# Patient Record
Sex: Female | Born: 1970 | Race: White | Hispanic: No | Smoking: Never smoker
Health system: Southern US, Community
[De-identification: ages and names within clinical notes are randomized; demographics above are authoritative.]

## PROBLEM LIST (undated history)

## (undated) DIAGNOSIS — F32A Depression, unspecified: Secondary | ICD-10-CM

## (undated) DIAGNOSIS — Z9889 Other specified postprocedural states: Secondary | ICD-10-CM

## (undated) DIAGNOSIS — F419 Anxiety disorder, unspecified: Secondary | ICD-10-CM

## (undated) DIAGNOSIS — R112 Nausea with vomiting, unspecified: Secondary | ICD-10-CM

## (undated) DIAGNOSIS — F329 Major depressive disorder, single episode, unspecified: Secondary | ICD-10-CM

## (undated) HISTORY — PX: TUBAL LIGATION: SHX77

## (undated) HISTORY — PX: WISDOM TOOTH EXTRACTION: SHX21

## (undated) HISTORY — PX: MYRINGOTOMY: SUR874

## (undated) HISTORY — PX: NOVASURE ABLATION: SHX5394

## (undated) HISTORY — PX: COLONOSCOPY: SHX174

---

## 2015-07-29 ENCOUNTER — Emergency Department (HOSPITAL_COMMUNITY): Payer: No Typology Code available for payment source

## 2015-07-29 ENCOUNTER — Encounter (HOSPITAL_COMMUNITY): Payer: Self-pay | Admitting: Emergency Medicine

## 2015-07-29 ENCOUNTER — Emergency Department (HOSPITAL_COMMUNITY)
Admission: EM | Admit: 2015-07-29 | Discharge: 2015-07-29 | Disposition: A | Discharge status: Home / Self Care | Payer: No Typology Code available for payment source | Attending: Emergency Medicine | Admitting: Emergency Medicine

## 2015-07-29 DIAGNOSIS — Z043 Encounter for examination and observation following other accident: Secondary | ICD-10-CM

## 2015-07-29 DIAGNOSIS — S8991XA Unspecified injury of right lower leg, initial encounter: Secondary | ICD-10-CM | POA: Diagnosis not present

## 2015-07-29 DIAGNOSIS — Z791 Long term (current) use of non-steroidal anti-inflammatories (NSAID): Secondary | ICD-10-CM | POA: Diagnosis not present

## 2015-07-29 DIAGNOSIS — M25561 Pain in right knee: Secondary | ICD-10-CM

## 2015-07-29 DIAGNOSIS — R110 Nausea: Secondary | ICD-10-CM | POA: Diagnosis not present

## 2015-07-29 DIAGNOSIS — Y9389 Activity, other specified: Secondary | ICD-10-CM | POA: Insufficient documentation

## 2015-07-29 DIAGNOSIS — Z041 Encounter for examination and observation following transport accident: Secondary | ICD-10-CM

## 2015-07-29 DIAGNOSIS — M542 Cervicalgia: Secondary | ICD-10-CM

## 2015-07-29 DIAGNOSIS — Y92410 Unspecified street and highway as the place of occurrence of the external cause: Secondary | ICD-10-CM | POA: Insufficient documentation

## 2015-07-29 DIAGNOSIS — Y998 Other external cause status: Secondary | ICD-10-CM | POA: Diagnosis not present

## 2015-07-29 DIAGNOSIS — S199XXA Unspecified injury of neck, initial encounter: Secondary | ICD-10-CM | POA: Insufficient documentation

## 2015-07-29 MED ORDER — ONDANSETRON 4 MG PO TBDP
4.0000 mg | ORAL_TABLET | Freq: Once | ORAL | Status: AC
  Administered 2015-07-29: 4 mg via ORAL
  Filled 2015-07-29: qty 1

## 2015-07-29 MED ORDER — TRAMADOL HCL 50 MG PO TABS: 50.0000 mg | ORAL_TABLET | Freq: Two times a day (BID) | ORAL | Status: DC | PRN

## 2015-07-29 MED ORDER — TRAMADOL HCL 50 MG PO TABS
50.0000 mg | ORAL_TABLET | Freq: Once | ORAL | Status: AC
  Administered 2015-07-29: 50 mg via ORAL
  Filled 2015-07-29: qty 1

## 2015-07-29 MED ORDER — CYCLOBENZAPRINE HCL 10 MG PO TABS
10.0000 mg | ORAL_TABLET | Freq: Once | ORAL | Status: AC
  Administered 2015-07-29: 10 mg via ORAL
  Filled 2015-07-29: qty 1

## 2015-07-29 MED ORDER — CYCLOBENZAPRINE HCL 10 MG PO TABS: 10.0000 mg | ORAL_TABLET | Freq: Two times a day (BID) | ORAL | Status: DC | PRN

## 2015-07-29 MED ORDER — IBUPROFEN 800 MG PO TABS: 800.0000 mg | ORAL_TABLET | Freq: Three times a day (TID) | ORAL | Status: DC

## 2015-07-29 NOTE — Discharge Instructions (Signed)
Cervical Strain and Sprain With Rehab Cervical strain and sprain are injuries that commonly occur with "whiplash" injuries. Whiplash occurs when the neck is forcefully whipped backward or forward, such as during a motor vehicle accident or during contact sports. The muscles, ligaments, tendons, discs, and nerves of the neck are susceptible to injury when this occurs. RISK FACTORS Risk of having a whiplash injury increases if:  Osteoarthritis of the spine.  Situations that make head or neck accidents or trauma more likely.  High-risk sports (football, rugby, wrestling, hockey, auto racing, gymnastics, diving, contact karate, or boxing).  Poor strength and flexibility of the neck.  Previous neck injury.  Poor tackling technique.  Improperly fitted or padded equipment. SYMPTOMS   Pain or stiffness in the front or back of neck or both.  Symptoms may present immediately or up to 24 hours after injury.  Dizziness, headache, nausea, and vomiting.  Muscle spasm with soreness and stiffness in the neck.  Tenderness and swelling at the injury site. PREVENTION  Learn and use proper technique (avoid tackling with the head, spearing, and head-butting; use proper falling techniques to avoid landing on the head).  Warm up and stretch properly before activity.  Maintain physical fitness:  Strength, flexibility, and endurance.  Cardiovascular fitness.  Wear properly fitted and padded protective equipment, such as padded soft collars, for participation in contact sports. PROGNOSIS  Recovery from cervical strain and sprain injuries is dependent on the extent of the injury. These injuries are usually curable in 1 week to 3 months with appropriate treatment.  RELATED COMPLICATIONS   Temporary numbness and weakness may occur if the nerve roots are damaged, and this may persist until the nerve has completely healed.  Chronic pain due to frequent recurrence of symptoms.  Prolonged healing,  especially if activity is resumed too soon (before complete recovery). TREATMENT  Treatment initially involves the use of ice and medication to help reduce pain and inflammation. It is also important to perform strengthening and stretching exercises and modify activities that worsen symptoms so the injury does not get worse. These exercises may be performed at home or with a therapist. For patients who experience severe symptoms, a soft, padded collar may be recommended to be worn around the neck.  Improving your posture may help reduce symptoms. Posture improvement includes pulling your chin and abdomen in while sitting or standing. If you are sitting, sit in a firm chair with your buttocks against the back of the chair. While sleeping, try replacing your pillow with a small towel rolled to 2 inches in diameter, or use a cervical pillow or soft cervical collar. Poor sleeping positions delay healing.  For patients with nerve root damage, which causes numbness or weakness, the use of a cervical traction apparatus may be recommended. Surgery is rarely necessary for these injuries. However, cervical strain and sprains that are present at birth (congenital) may require surgery. MEDICATION   If pain medication is necessary, nonsteroidal anti-inflammatory medications, such as aspirin and ibuprofen, or other minor pain relievers, such as acetaminophen, are often recommended.  Do not take pain medication for 7 days before surgery.  Prescription pain relievers may be given if deemed necessary by your caregiver. Use only as directed and only as much as you need. HEAT AND COLD:   Cold treatment (icing) relieves pain and reduces inflammation. Cold treatment should be applied for 10 to 15 minutes every 2 to 3 hours for inflammation and pain and immediately after any activity that aggravates your  HEAT AND COLD:   · Cold treatment (icing) relieves pain and reduces inflammation. Cold treatment should be applied for 10 to 15 minutes every 2 to 3 hours for inflammation and pain and immediately after any activity that aggravates your symptoms. Use ice packs or an ice massage.  · Heat treatment may be used prior to performing the stretching and  strengthening activities prescribed by your caregiver, physical therapist, or athletic trainer. Use a heat pack or a warm soak.  SEEK MEDICAL CARE IF:   · Symptoms get worse or do not improve in 2 weeks despite treatment.  · New, unexplained symptoms develop (drugs used in treatment may produce side effects).  EXERCISES  RANGE OF MOTION (ROM) AND STRETCHING EXERCISES - Cervical Strain and Sprain  These exercises may help you when beginning to rehabilitate your injury. In order to successfully resolve your symptoms, you must improve your posture. These exercises are designed to help reduce the forward-head and rounded-shoulder posture which contributes to this condition. Your symptoms may resolve with or without further involvement from your physician, physical therapist or athletic trainer. While completing these exercises, remember:   · Restoring tissue flexibility helps normal motion to return to the joints. This allows healthier, less painful movement and activity.  · An effective stretch should be held for at least 20 seconds, although you may need to begin with shorter hold times for comfort.  · A stretch should never be painful. You should only feel a gentle lengthening or release in the stretched tissue.  STRETCH- Axial Extensors  · Lie on your back on the floor. You may bend your knees for comfort. Place a rolled-up hand towel or dish towel, about 2 inches in diameter, under the part of your head that makes contact with the floor.  · Gently tuck your chin, as if trying to make a "double chin," until you feel a gentle stretch at the base of your head.  · Hold __________ seconds.  Repeat __________ times. Complete this exercise __________ times per day.   STRETCH - Axial Extension   · Stand or sit on a firm surface. Assume a good posture: chest up, shoulders drawn back, abdominal muscles slightly tense, knees unlocked (if standing) and feet hip width apart.  · Slowly retract your chin so your head slides back  and your chin slightly lowers. Continue to look straight ahead.  · You should feel a gentle stretch in the back of your head. Be certain not to feel an aggressive stretch since this can cause headaches later.  · Hold for __________ seconds.  Repeat __________ times. Complete this exercise __________ times per day.  STRETCH - Cervical Side Bend   · Stand or sit on a firm surface. Assume a good posture: chest up, shoulders drawn back, abdominal muscles slightly tense, knees unlocked (if standing) and feet hip width apart.  · Without letting your nose or shoulders move, slowly tip your right / left ear to your shoulder until your feel a gentle stretch in the muscles on the opposite side of your neck.  · Hold __________ seconds.  Repeat __________ times. Complete this exercise __________ times per day.  STRETCH - Cervical Rotators   · Stand or sit on a firm surface. Assume a good posture: chest up, shoulders drawn back, abdominal muscles slightly tense, knees unlocked (if standing) and feet hip width apart.  · Keeping your eyes level with the ground, slowly turn your head until you feel a gentle stretch along   the back and opposite side of your neck.  · Hold __________ seconds.  Repeat __________ times. Complete this exercise __________ times per day.  RANGE OF MOTION - Neck Circles   · Stand or sit on a firm surface. Assume a good posture: chest up, shoulders drawn back, abdominal muscles slightly tense, knees unlocked (if standing) and feet hip width apart.  · Gently roll your head down and around from the back of one shoulder to the back of the other. The motion should never be forced or painful.  · Repeat the motion 10-20 times, or until you feel the neck muscles relax and loosen.  Repeat __________ times. Complete the exercise __________ times per day.  STRENGTHENING EXERCISES - Cervical Strain and Sprain  These exercises may help you when beginning to rehabilitate your injury. They may resolve your symptoms with or  without further involvement from your physician, physical therapist, or athletic trainer. While completing these exercises, remember:   · Muscles can gain both the endurance and the strength needed for everyday activities through controlled exercises.  · Complete these exercises as instructed by your physician, physical therapist, or athletic trainer. Progress the resistance and repetitions only as guided.  · You may experience muscle soreness or fatigue, but the pain or discomfort you are trying to eliminate should never worsen during these exercises. If this pain does worsen, stop and make certain you are following the directions exactly. If the pain is still present after adjustments, discontinue the exercise until you can discuss the trouble with your clinician.  STRENGTH - Cervical Flexors, Isometric  · Face a wall, standing about 6 inches away. Place a small pillow, a ball about 6-8 inches in diameter, or a folded towel between your forehead and the wall.  · Slightly tuck your chin and gently push your forehead into the soft object. Push only with mild to moderate intensity, building up tension gradually. Keep your jaw and forehead relaxed.  · Hold 10 to 20 seconds. Keep your breathing relaxed.  · Release the tension slowly. Relax your neck muscles completely before you start the next repetition.  Repeat __________ times. Complete this exercise __________ times per day.  STRENGTH- Cervical Lateral Flexors, Isometric   · Stand about 6 inches away from a wall. Place a small pillow, a ball about 6-8 inches in diameter, or a folded towel between the side of your head and the wall.  · Slightly tuck your chin and gently tilt your head into the soft object. Push only with mild to moderate intensity, building up tension gradually. Keep your jaw and forehead relaxed.  · Hold 10 to 20 seconds. Keep your breathing relaxed.  · Release the tension slowly. Relax your neck muscles completely before you start the next  repetition.  Repeat __________ times. Complete this exercise __________ times per day.  STRENGTH - Cervical Extensors, Isometric   · Stand about 6 inches away from a wall. Place a small pillow, a ball about 6-8 inches in diameter, or a folded towel between the back of your head and the wall.  · Slightly tuck your chin and gently tilt your head back into the soft object. Push only with mild to moderate intensity, building up tension gradually. Keep your jaw and forehead relaxed.  · Hold 10 to 20 seconds. Keep your breathing relaxed.  · Release the tension slowly. Relax your neck muscles completely before you start the next repetition.  Repeat __________ times. Complete this exercise __________ times per day.    All of your joints have less wear and tear when properly supported by a spine with good posture. This means you will experience a healthier, less painful body.  Correct posture must be practiced with all of your activities, especially prolonged sitting and standing. Correct posture is as important when doing repetitive low-stress activities (typing) as it is when doing a single heavy-load activity (lifting). PROLONGED STANDING WHILE SLIGHTLY LEANING FORWARD When completing a task that requires you to lean forward while standing in one  place for a long time, place either foot up on a stationary 2- to 4-inch high object to help maintain the best posture. When both feet are on the ground, the low back tends to lose its slight inward curve. If this curve flattens (or becomes too large), then the back and your other joints will experience too much stress, fatigue more quickly, and can cause pain.  RESTING POSITIONS Consider which positions are most painful for you when choosing a resting position. If you have pain with flexion-based activities (sitting, bending, stooping, squatting), choose a position that allows you to rest in a less flexed posture. You would want to avoid curling into a fetal position on your side. If your pain worsens with extension-based activities (prolonged standing, working overhead), avoid resting in an extended position such as sleeping on your stomach. Most people will find more comfort when they rest with their spine in a more neutral position, neither too rounded nor too arched. Lying on a non-sagging bed on your side with a pillow between your knees, or on your back with a pillow under your knees will often provide some relief. Keep in mind, being in any one position for a prolonged period of time, no matter how correct your posture, can still lead to stiffness. WALKING Walk with an upright posture. Your ears, shoulders, and hips should all line up. OFFICE WORK When working at a desk, create an environment that supports good, upright posture. Without extra support, muscles fatigue and lead to excessive strain on joints and other tissues. CHAIR:  A chair should be able to slide under your desk when your back makes contact with the back of the chair. This allows you to work closely.  The chair's height should allow your eyes to be level with the upper part of your monitor and your hands to be slightly lower than your elbows.  Body position:  Your feet should make contact with the floor. If this is not  possible, use a foot rest.  Keep your ears over your shoulders. This will reduce stress on your neck and low back.   This information is not intended to replace advice given to you by your health care provider. Make sure you discuss any questions you have with your health care provider.   Document Released: 10/03/2005 Document Revised: 10/24/2014 Document Reviewed: 01/15/2009 Elsevier Interactive Patient Education 2016 [place] therapy can help ease sore, stiff, injured, and tight muscles and joints. Heat relaxes your muscles, which may help ease your pain.  RISKS AND COMPLICATIONS If you have any of the following conditions, do not use heat therapy unless your health care provider has approved:  Poor circulation.  Healing wounds or scarred skin in the area being treated.  Diabetes, heart disease, or high blood pressure.  Not being able to feel (numbness) the area being treated.  Unusual swelling of the area being treated.  Active infections.  Blood clots.  Cancer.  Inability to communicate pain.  This may include young children and people who have problems with their brain function (dementia).  Pregnancy. Heat therapy should only be used on old, pre-existing, or long-lasting (chronic) injuries. Do not use heat therapy on new injuries unless directed by your health care provider. HOW TO USE HEAT THERAPY There are several different kinds of heat therapy, including:  Moist heat pack.  Warm water bath.  Hot water bottle.  Electric heating pad.  Heated gel pack.  Heated wrap.  Electric heating pad. Use the heat therapy method suggested by your health care provider. Follow your health care provider's instructions on when and how to use heat therapy. GENERAL HEAT THERAPY RECOMMENDATIONS  Do not sleep while using heat therapy. Only use heat therapy while you are awake.  Your skin may turn pink while using heat therapy. Do not use heat therapy if  your skin turns red.  Do not use heat therapy if you have new pain.  High heat or long exposure to heat can cause burns. Be careful when using heat therapy to avoid burning your skin.  Do not use heat therapy on areas of your skin that are already irritated, such as with a rash or sunburn. SEEK MEDICAL CARE IF:  You have blisters, redness, swelling, or numbness.  You have new pain.  Your pain is worse. MAKE SURE YOU:  Understand these instructions. Knee Pain Knee pain is a very common symptom and can have many causes. Knee pain often goes away when you follow your health care provider's instructions for relieving pain and discomfort at home. However, knee pain can develop into a condition that needs treatment. Some conditions may include: Arthritis caused by wear and tear (osteoarthritis). Arthritis caused by swelling and irritation (rheumatoid arthritis or gout). A cyst or growth in your knee. An infection in your knee joint. An injury that will not heal. Damage, swelling, or irritation of the tissues that support your knee (torn ligaments or tendinitis). If your knee pain continues, additional tests may be ordered to diagnose your condition. Tests may include X-rays or other imaging studies of your knee. You may also need to have fluid removed from your knee. Treatment for ongoing knee pain depends on the cause, but treatment may include: Medicines to relieve pain or swelling. Steroid injections in your knee. Physical therapy. Surgery. HOME CARE INSTRUCTIONS Take medicines only as directed by your health care provider. Rest your knee and keep it raised (elevated) while you are resting. Do not do things that cause or worsen pain. Avoid high-impact activities or exercises, such as running, jumping rope, or doing jumping jacks. Apply ice to the knee area: Put ice in a plastic bag. Place a towel between your skin and the bag. Leave the ice on for 20 minutes, 2-3 times a day. Ask  your health care provider if you should wear an elastic knee support. Keep a pillow under your knee when you sleep. Lose weight if you are overweight. Extra weight can put pressure on your knee. Do not use any tobacco products, including cigarettes, chewing tobacco, or electronic cigarettes. If you need help quitting, ask your health care provider. Smoking may slow the healing of any bone and joint problems that you may have. SEEK MEDICAL CARE IF: Your knee pain continues, changes, or gets worse. You have a fever along with knee pain. Your knee buckles or locks up. Your knee becomes more swollen. SEEK IMMEDIATE MEDICAL CARE IF:  Your knee joint feels hot to the touch. You  have chest pain or trouble breathing.   This information is not intended to replace advice given to you by your health care provider. Make sure you discuss any questions you have with your health care provider.   Document Released: 07/31/2007 Document Revised: 10/24/2014 Document Reviewed: 05/19/2014 Elsevier Interactive Patient Education 2016 [employer].   Will watch your condition.  Will get help right away if you are not doing well or get worse.   This information is not intended to replace advice given to you by your health care provider. Make sure you discuss any questions you have with your health care provider.   Document Released: 12/26/2011 Document Revised: 10/24/2014 Document Reviewed: 11/26/2013 Elsevier Interactive Patient Education [employer].
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
## 2015-08-23 ENCOUNTER — Emergency Department (HOSPITAL_COMMUNITY)
Admission: EM | Admit: 2015-08-23 | Discharge: 2015-08-23 | Disposition: A | Discharge status: Home / Self Care | Payer: No Typology Code available for payment source | Attending: Emergency Medicine | Admitting: Emergency Medicine

## 2015-08-23 DIAGNOSIS — M542 Cervicalgia: Secondary | ICD-10-CM | POA: Insufficient documentation

## 2015-08-23 DIAGNOSIS — M546 Pain in thoracic spine: Secondary | ICD-10-CM | POA: Diagnosis not present

## 2015-08-23 DIAGNOSIS — Z791 Long term (current) use of non-steroidal anti-inflammatories (NSAID): Secondary | ICD-10-CM | POA: Insufficient documentation

## 2015-08-23 DIAGNOSIS — M545 Low back pain: Secondary | ICD-10-CM

## 2015-08-23 MED ORDER — TRAMADOL HCL 50 MG PO TABS: 50.0000 mg | ORAL_TABLET | Freq: Four times a day (QID) | ORAL | Status: DC | PRN

## 2015-08-23 NOTE — Discharge Instructions (Signed)
Tramadol as prescribed as needed for pain.  Please followup with your primary doctor if not improving in the next week.   Back Pain, Adult Back pain is very common in adults.The cause of back pain is rarely dangerous and the pain often gets better over time.The cause of your back pain may not be known. Some common causes of back pain include:  Strain of the muscles or ligaments supporting the spine.  Wear and tear (degeneration) of the spinal disks.  Arthritis.  Direct injury to the back. For many people, back pain may return. Since back pain is rarely dangerous, most people can learn to manage this condition on their own. HOME CARE INSTRUCTIONS Watch your back pain for any changes. The following actions may help to lessen any discomfort you are feeling:  Remain active. It is stressful on your back to sit or stand in one place for long periods of time. Do not sit, drive, or stand in one place for more than 30 minutes at a time. Take short walks on even surfaces as soon as you are able.Try to increase the length of time you walk each day.  Exercise regularly as directed by your health care provider. Exercise helps your back heal faster. It also helps avoid future injury by keeping your muscles strong and flexible.  Do not stay in bed.Resting more than 1-2 days can delay your recovery.  Pay attention to your body when you bend and lift. The most comfortable positions are those that put less stress on your recovering back. Always use proper lifting techniques, including:  Bending your knees.  Keeping the load close to your body.  Avoiding twisting.  Find a comfortable position to sleep. Use a firm mattress and lie on your side with your knees slightly bent. If you lie on your back, put a pillow under your knees.  Avoid feeling anxious or stressed.Stress increases muscle tension and can worsen back pain.It is important to recognize when you are anxious or stressed and learn ways  to manage it, such as with exercise.  Take medicines only as directed by your health care provider. Over-the-counter medicines to reduce pain and inflammation are often the most helpful.Your health care provider may prescribe muscle relaxant drugs.These medicines help dull your pain so you can more quickly return to your normal activities and healthy exercise.  Apply ice to the injured area:  Put ice in a plastic bag.  Place a towel between your skin and the bag.  Leave the ice on for 20 minutes, 2-3 times a day for the first 2-3 days. After that, ice and heat may be alternated to reduce pain and spasms.  Maintain a healthy weight. Excess weight puts extra stress on your back and makes it difficult to maintain good posture. SEEK MEDICAL CARE IF:  You have pain that is not relieved with rest or medicine.  You have increasing pain going down into the legs or buttocks.  You have pain that does not improve in one week.  You have night pain.  You lose weight.  You have a fever or chills. SEEK IMMEDIATE MEDICAL CARE IF:   You develop new bowel or bladder control problems.  You have unusual weakness or numbness in your arms or legs.  You develop nausea or vomiting.  You develop abdominal pain.  You feel faint.   This information is not intended to replace advice given to you by your health care provider. Make sure you discuss any questions you  have with your health care provider.   Document Released: 10/03/2005 Document Revised: 10/24/2014 Document Reviewed: 02/04/2014 Elsevier Interactive Patient Education [employer].
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
## 2016-08-22 ENCOUNTER — Other Ambulatory Visit: Payer: Self-pay | Admitting: Family Medicine

## 2016-08-22 ENCOUNTER — Other Ambulatory Visit (HOSPITAL_COMMUNITY)
Admission: RE | Admit: 2016-08-22 | Discharge: 2016-08-22 | Disposition: A | Discharge status: Home / Self Care | Payer: BLUE CROSS/BLUE SHIELD | Source: Ambulatory Visit | Attending: Family Medicine | Admitting: Family Medicine

## 2016-08-22 DIAGNOSIS — Z01419 Encounter for gynecological examination (general) (routine) without abnormal findings: Secondary | ICD-10-CM | POA: Insufficient documentation

## 2016-08-22 DIAGNOSIS — Z1151 Encounter for screening for human papillomavirus (HPV): Secondary | ICD-10-CM | POA: Insufficient documentation

## 2016-08-22 DIAGNOSIS — N921 Excessive and frequent menstruation with irregular cycle: Secondary | ICD-10-CM

## 2016-08-23 ENCOUNTER — Ambulatory Visit
Admission: RE | Admit: 2016-08-23 | Discharge: 2016-08-23 | Disposition: A | Discharge status: Home / Self Care | Payer: BLUE CROSS/BLUE SHIELD | Source: Ambulatory Visit | Attending: Family Medicine | Admitting: Family Medicine

## 2016-08-23 DIAGNOSIS — N921 Excessive and frequent menstruation with irregular cycle: Secondary | ICD-10-CM

## 2016-08-24 LAB — CYTOLOGY - PAP
Diagnosis: NEGATIVE
HPV: NOT DETECTED

## 2016-09-14 ENCOUNTER — Other Ambulatory Visit: Payer: Self-pay | Admitting: Obstetrics & Gynecology

## 2016-09-16 ENCOUNTER — Other Ambulatory Visit: Payer: Self-pay | Admitting: Gastroenterology

## 2016-09-16 ENCOUNTER — Ambulatory Visit
Admission: RE | Admit: 2016-09-16 | Discharge: 2016-09-16 | Disposition: A | Discharge status: Home / Self Care | Payer: BLUE CROSS/BLUE SHIELD | Source: Ambulatory Visit | Attending: Gastroenterology | Admitting: Gastroenterology

## 2016-09-16 DIAGNOSIS — K5900 Constipation, unspecified: Secondary | ICD-10-CM

## 2016-09-16 DIAGNOSIS — R1084 Generalized abdominal pain: Secondary | ICD-10-CM

## 2016-11-14 ENCOUNTER — Encounter (HOSPITAL_COMMUNITY): Payer: Self-pay

## 2016-11-14 ENCOUNTER — Encounter (HOSPITAL_COMMUNITY)
Admission: RE | Admit: 2016-11-14 | Discharge: 2016-11-14 | Disposition: A | Discharge status: Home / Self Care | Payer: BLUE CROSS/BLUE SHIELD | Source: Ambulatory Visit | Attending: Obstetrics & Gynecology | Admitting: Obstetrics & Gynecology

## 2016-11-14 DIAGNOSIS — Z01812 Encounter for preprocedural laboratory examination: Secondary | ICD-10-CM | POA: Insufficient documentation

## 2016-11-14 HISTORY — DX: Other specified postprocedural states: R11.2

## 2016-11-14 HISTORY — DX: Major depressive disorder, single episode, unspecified: F32.9

## 2016-11-14 HISTORY — DX: Anxiety disorder, unspecified: F41.9

## 2016-11-14 HISTORY — DX: Depression, unspecified: F32.A

## 2016-11-14 HISTORY — DX: Other specified postprocedural states: Z98.890

## 2016-11-14 LAB — COMPREHENSIVE METABOLIC PANEL
ALT: 12 U/L — AB (ref 14–54)
AST: 14 U/L — ABNORMAL LOW (ref 15–41)
Albumin: 3.7 g/dL (ref 3.5–5.0)
Alkaline Phosphatase: 33 U/L — ABNORMAL LOW (ref 38–126)
Anion gap: 5 (ref 5–15)
BUN: 6 mg/dL (ref 6–20)
CHLORIDE: 111 mmol/L (ref 101–111)
CO2: 25 mmol/L (ref 22–32)
CREATININE: 0.84 mg/dL (ref 0.44–1.00)
Calcium: 8.4 mg/dL — ABNORMAL LOW (ref 8.9–10.3)
GFR calc Af Amer: >60 mL/min (ref >60)
Glucose, Bld: 104 mg/dL — ABNORMAL HIGH (ref 65–99)
POTASSIUM: 3.6 mmol/L (ref 3.5–5.1)
SODIUM: 141 mmol/L (ref 135–145)
Total Bilirubin: 0.6 mg/dL (ref 0.3–1.2)
Total Protein: 6.6 g/dL (ref 6.5–8.1)

## 2016-11-14 LAB — TYPE AND SCREEN
ABO/RH(D): O NEG
ANTIBODY SCREEN: NEGATIVE

## 2016-11-14 LAB — CBC
HCT: 36.1 % (ref 36.0–46.0)
Hemoglobin: 12.3 g/dL (ref 12.0–15.0)
MCH: 29.6 pg (ref 26.0–34.0)
MCHC: 34.1 g/dL (ref 30.0–36.0)
MCV: 87 fL (ref 78.0–100.0)
PLATELETS: 215 10*3/uL (ref 150–400)
RBC: 4.15 MIL/uL (ref 3.87–5.11)
RDW: 13.5 % (ref 11.5–15.5)
WBC: 5.7 10*3/uL (ref 4.0–10.5)

## 2016-11-14 LAB — ABO/RH: ABO/RH(D): O NEG

## 2016-11-14 NOTE — Patient Instructions (Signed)
Your procedure is scheduled on:  Thursday, Feb. 1, 2018  Enter through the [employer] [facility] at:  8:00 AM  Pick up the phone at the desk and dial [phone].  Call this number if you have problems the morning of surgery: [phone].  Remember: Do NOT eat food or drink after: Midnight Wednesday, Jan. 31, 2018  Take these medicines the morning of surgery with a SIP OF WATER:  None  Stop ALL herbal medications at this time  Do NOT smoke the day of surgery.  Do NOT wear jewelry (body piercing), metal hair clips/bobby pins, make-up, or nail polish. Do NOT wear lotions, powders, or perfumes.  You may wear deodorant. Do NOT shave for 48 hours prior to surgery. Do NOT bring valuables to the hospital. Contacts, dentures, or bridgework may not be worn into surgery.  Leave suitcase in car.  After surgery it may be brought to your room.  For patients admitted to the hospital, checkout time is 11:00 AM the day of discharge.  Bring a copy of your healthcare power of attorney and living will documents.  **Effective Friday, Jan. 12, 2018, [facility] will implement no hospital visitations from children age 31 and younger due to a steady increase in flu activity in our community and hospitals. **

## 2016-11-14 NOTE — H&P (Signed)
GYN ADMIT:  S: 46yo G1P1 who presents for scheduled TAH, BS due to continued abnormal uterine bleeding.  Since August she has had daily bleeding. The bleeding starts out brown and then turns to bright red by the end of the day. She uses about 2 tampons per day. The bleeding doesn't change and has been continous since that time. She does not occasional pelvic pain; however, she thinks this may be related to her constipation.   Of note she has a h/o of an endometrial ablation as well as a tubal ligation.  Conservative therapy was attempted with medications; however, she noted minimal to no change in her menses.  Work up has included:  TVUS on 08/23/16: 11.8x9.7x11.2cm uterus with dominant 8cm fibroid. Unable to assess endometrium due to prominence of the fibroid (inadequate EMB). Normal ovaries bilaterally. Hgb (11/4): 13.3  After discussion regarding surgical options pt wishes to proceed with surgical management.  Current Medications  Taking   Escitalopram Oxalate 10 MG Tablet 1 tablet Orally Once a day   Medication List reviewed and reconciled with the patient    Past Medical History  Depression.   Anxiety.    Surgical History  c section 09/1994  tubal ligation 12/1994  uterine ablation 03/2009   Family History  Father: alive, diagnosed with DM, HTN  Mother: alive, melanoma , diagnosed with DM, HTN  denies any GYN family cancer hx.   Social History  General:  Tobacco use  cigarettes: Never smoked Tobacco history last updated 09/14/2016 no Alcohol.  Caffeine: yes, soda, tea.  no Recreational drug use.    Gyn History  Sexual activity currently sexually active.  Periods : irregular.  LMP: see HPI  Birth control BTL.  Last pap smear date 08/22/2016 Negative.  Denies H/O Last mammogram date.  Denies H/O Abnormal pap smear.  Denies H/O STD.    OB History  Pregnancy # 1 live birth, C-section delivery, girl.    Allergies  Augmentin: rash   Hospitalization/Major  Diagnostic Procedure  No Hospitalization History.   Review of Systems  CONSTITUTIONAL:  no Chills. no Fever. no Night sweats.  HEENT:  Blurrred vision no. no Double vision.  CARDIOLOGY:  no Chest pain.  RESPIRATORY:  no Shortness of breath. no Cough.  UROLOGY:  no Urinary frequency. no Urinary incontinence. no Urinary urgency.  GASTROENTEROLOGY:  Abdominal pain yes. Appetite change yes. Bloating/belching yes. Change in bowel movements yes.  FEMALE REPRODUCTIVE:  no Breast lumps or discharge. no Breast pain.  NEUROLOGY:  no Dizziness. no Headache. no Loss of consciousness.  PSYCHOLOGY:  Anxiety yes. Depression yes.  SKIN:  no Rash. no Hives.  HEMATOLOGY/LYMPH:  no Anemia. no Fatigue. Using Blood Thinners no.     O: Performed in office: GENERAL APPEARANCE well developed, well nourished .  SKIN: warm and dry, no rashes .  NECK: supple, normal appearance .  LUNGS: regular breathing rate and effort .  HEART: no murmurs, regular rate and rhythm.  ABDOMEN: soft, +RLQ tenderness, uterus below umbilicus- non-tender, no rebound, no guarding, +BS.  FEMALE GENITOURINARY: normal external genitalia, labia - unremarkable, vagina - pink moist mucosa, no lesions or abnormal discharge, cervix - no discharge or lesions or CMT, adnexa - no masses or tenderness, uterus - nontender, enlarged on bimanual exam (mobile), rectal vault does not feel impacted.  RECTAL pt declined.  MUSCULOSKELETAL no calf tenderness bilaterally .  EXTREMITIES: no edema present .  PSYCH: appropriate mood and affect .   A/P: 46yo G1P1 who presents  for TAH, BS due to AUB and uterine fibroids -NPO -LR @ 125cc/hr -Gent/Clinda to OR -SCDs to OR -Risk/benefit and alternative reviewed with patient.  Risks discussed including but not limited to risk of bleeding, infection and injury to surrounding organs.  Questions and concerns were addressed and pt wishes to proceed with surgical management.  Janyth Pupa,  DO [phone] (pager) [phone] (office)

## 2016-11-16 MED ORDER — DEXTROSE 5 % IV SOLN
INTRAVENOUS | Status: AC
  Administered 2016-11-17: 100 mL via INTRAVENOUS
  Filled 2016-11-16: qty 7.5

## 2016-11-17 ENCOUNTER — Inpatient Hospital Stay (HOSPITAL_COMMUNITY)
Admission: RE | Admit: 2016-11-17 | Discharge: 2016-11-19 | DRG: 743 | Disposition: A | Discharge status: Home / Self Care | Payer: BLUE CROSS/BLUE SHIELD | Source: Ambulatory Visit | Attending: Obstetrics & Gynecology | Admitting: Obstetrics & Gynecology

## 2016-11-17 ENCOUNTER — Encounter (HOSPITAL_COMMUNITY): Payer: Self-pay | Admitting: Emergency Medicine

## 2016-11-17 ENCOUNTER — Inpatient Hospital Stay (HOSPITAL_COMMUNITY): Payer: BLUE CROSS/BLUE SHIELD | Admitting: Anesthesiology

## 2016-11-17 ENCOUNTER — Encounter (HOSPITAL_COMMUNITY)
Admission: RE | Disposition: A | Discharge status: Home / Self Care | Payer: Self-pay | Source: Ambulatory Visit | Attending: Obstetrics & Gynecology

## 2016-11-17 DIAGNOSIS — R102 Pelvic and perineal pain: Secondary | ICD-10-CM | POA: Diagnosis present

## 2016-11-17 DIAGNOSIS — F418 Other specified anxiety disorders: Secondary | ICD-10-CM | POA: Diagnosis present

## 2016-11-17 DIAGNOSIS — N939 Abnormal uterine and vaginal bleeding, unspecified: Principal | ICD-10-CM | POA: Diagnosis present

## 2016-11-17 DIAGNOSIS — Z720 Tobacco use: Secondary | ICD-10-CM | POA: Diagnosis not present

## 2016-11-17 DIAGNOSIS — D259 Leiomyoma of uterus, unspecified: Secondary | ICD-10-CM | POA: Diagnosis present

## 2016-11-17 HISTORY — PX: HYSTERECTOMY ABDOMINAL WITH SALPINGECTOMY: SHX6725

## 2016-11-17 SURGERY — HYSTERECTOMY, TOTAL, ABDOMINAL, WITH SALPINGECTOMY
Anesthesia: General | Laterality: Bilateral

## 2016-11-17 MED ORDER — KETOROLAC TROMETHAMINE 30 MG/ML IJ SOLN
INTRAMUSCULAR | Status: DC | PRN
  Administered 2016-11-17: 30 mg via INTRAVENOUS

## 2016-11-17 MED ORDER — DIPHENHYDRAMINE HCL 50 MG/ML IJ SOLN: 12.5000 mg | Freq: Four times a day (QID) | INTRAMUSCULAR | Status: DC | PRN

## 2016-11-17 MED ORDER — FENTANYL CITRATE (PF) 100 MCG/2ML IJ SOLN
INTRAMUSCULAR | Status: DC | PRN
Start: 2016-11-17 — End: 2016-11-17
  Administered 2016-11-17 (×2): 100 ug via INTRAVENOUS
  Administered 2016-11-17: 50 ug via INTRAVENOUS
  Administered 2016-11-17 (×2): 100 ug via INTRAVENOUS
  Administered 2016-11-17: 50 ug via INTRAVENOUS

## 2016-11-17 MED ORDER — DOCUSATE SODIUM 100 MG PO CAPS
100.0000 mg | ORAL_CAPSULE | Freq: Two times a day (BID) | ORAL | Status: DC
  Administered 2016-11-18 – 2016-11-19 (×3): 100 mg via ORAL
  Filled 2016-11-17 (×4): qty 1

## 2016-11-17 MED ORDER — PANTOPRAZOLE SODIUM 40 MG IV SOLR
40.0000 mg | Freq: Every day | INTRAVENOUS | Status: DC
  Administered 2016-11-17 – 2016-11-18 (×2): 40 mg via INTRAVENOUS
  Filled 2016-11-17 (×2): qty 40

## 2016-11-17 MED ORDER — LIDOCAINE HCL (CARDIAC) 20 MG/ML IV SOLN
INTRAVENOUS | Status: DC | PRN
  Administered 2016-11-17: 100 mg via INTRAVENOUS

## 2016-11-17 MED ORDER — ONDANSETRON HCL 4 MG/2ML IJ SOLN
INTRAMUSCULAR | Status: AC
  Filled 2016-11-17: qty 2

## 2016-11-17 MED ORDER — ONDANSETRON HCL 4 MG/2ML IJ SOLN: 4.0000 mg | Freq: Four times a day (QID) | INTRAMUSCULAR | Status: DC | PRN

## 2016-11-17 MED ORDER — HYDROMORPHONE HCL 1 MG/ML IJ SOLN
INTRAMUSCULAR | Status: AC
  Filled 2016-11-17: qty 1

## 2016-11-17 MED ORDER — PROPOFOL 10 MG/ML IV BOLUS
INTRAVENOUS | Status: AC
  Filled 2016-11-17: qty 20

## 2016-11-17 MED ORDER — HYDROMORPHONE HCL 1 MG/ML IJ SOLN
0.2500 mg | INTRAMUSCULAR | Status: DC | PRN
  Administered 2016-11-17 (×2): 0.5 mg via INTRAVENOUS

## 2016-11-17 MED ORDER — SIMETHICONE 80 MG PO CHEW: 80.0000 mg | CHEWABLE_TABLET | Freq: Four times a day (QID) | ORAL | Status: DC | PRN

## 2016-11-17 MED ORDER — PROMETHAZINE HCL 25 MG/ML IJ SOLN
INTRAMUSCULAR | Status: AC
  Filled 2016-11-17: qty 1

## 2016-11-17 MED ORDER — NALOXONE HCL 0.4 MG/ML IJ SOLN: 0.4000 mg | INTRAMUSCULAR | Status: DC | PRN

## 2016-11-17 MED ORDER — INFLUENZA VAC SPLIT QUAD 0.5 ML IM SUSY
0.5000 mL | PREFILLED_SYRINGE | INTRAMUSCULAR | Status: AC
  Administered 2016-11-19: 0.5 mL via INTRAMUSCULAR

## 2016-11-17 MED ORDER — DIPHENHYDRAMINE HCL 12.5 MG/5ML PO ELIX: 12.5000 mg | ORAL_SOLUTION | Freq: Four times a day (QID) | ORAL | Status: DC | PRN

## 2016-11-17 MED ORDER — ESCITALOPRAM OXALATE 5 MG PO TABS
5.0000 mg | ORAL_TABLET | Freq: Every evening | ORAL | Status: DC
  Administered 2016-11-17 – 2016-11-18 (×2): 5 mg via ORAL
  Filled 2016-11-17 (×2): qty 1

## 2016-11-17 MED ORDER — SCOPOLAMINE 1 MG/3DAYS TD PT72
1.0000 | MEDICATED_PATCH | Freq: Once | TRANSDERMAL | Status: DC
  Administered 2016-11-17: 1.5 mg via TRANSDERMAL

## 2016-11-17 MED ORDER — KETOROLAC TROMETHAMINE 30 MG/ML IJ SOLN: 30.0000 mg | Freq: Four times a day (QID) | INTRAMUSCULAR | Status: DC

## 2016-11-17 MED ORDER — MIDAZOLAM HCL 5 MG/5ML IJ SOLN
INTRAMUSCULAR | Status: DC | PRN
  Administered 2016-11-17: 2 mg via INTRAVENOUS

## 2016-11-17 MED ORDER — MENTHOL 3 MG MT LOZG: 1.0000 | LOZENGE | OROMUCOSAL | Status: DC | PRN

## 2016-11-17 MED ORDER — ONDANSETRON HCL 4 MG/2ML IJ SOLN
INTRAMUSCULAR | Status: DC | PRN
  Administered 2016-11-17: 4 mg via INTRAVENOUS

## 2016-11-17 MED ORDER — SUGAMMADEX SODIUM 200 MG/2ML IV SOLN
INTRAVENOUS | Status: DC | PRN
  Administered 2016-11-17: 150 mg via INTRAVENOUS

## 2016-11-17 MED ORDER — KETOROLAC TROMETHAMINE 30 MG/ML IJ SOLN
30.0000 mg | Freq: Four times a day (QID) | INTRAMUSCULAR | Status: DC
  Administered 2016-11-17 – 2016-11-18 (×4): 30 mg via INTRAVENOUS
  Filled 2016-11-17 (×4): qty 1

## 2016-11-17 MED ORDER — SODIUM CHLORIDE 0.9% FLUSH: 9.0000 mL | INTRAVENOUS | Status: DC | PRN

## 2016-11-17 MED ORDER — HYDROMORPHONE HCL 1 MG/ML IJ SOLN
INTRAMUSCULAR | Status: DC | PRN
  Administered 2016-11-17: 1 mg via INTRAVENOUS

## 2016-11-17 MED ORDER — ROCURONIUM BROMIDE 100 MG/10ML IV SOLN
INTRAVENOUS | Status: DC | PRN
  Administered 2016-11-17: 10 mg via INTRAVENOUS
  Administered 2016-11-17: 60 mg via INTRAVENOUS

## 2016-11-17 MED ORDER — SUGAMMADEX SODIUM 200 MG/2ML IV SOLN
INTRAVENOUS | Status: AC
  Filled 2016-11-17: qty 2

## 2016-11-17 MED ORDER — PROMETHAZINE HCL 25 MG/ML IJ SOLN
6.2500 mg | INTRAMUSCULAR | Status: DC | PRN
Start: 2016-11-17 — End: 2016-11-17
  Administered 2016-11-17: 12.5 mg via INTRAVENOUS

## 2016-11-17 MED ORDER — ROCURONIUM BROMIDE 100 MG/10ML IV SOLN
INTRAVENOUS | Status: AC
  Filled 2016-11-17: qty 1

## 2016-11-17 MED ORDER — PROPOFOL 10 MG/ML IV BOLUS
INTRAVENOUS | Status: DC | PRN
  Administered 2016-11-17: 200 mg via INTRAVENOUS

## 2016-11-17 MED ORDER — LACTATED RINGERS IV SOLN
INTRAVENOUS | Status: DC
  Administered 2016-11-17 – 2016-11-18 (×4): via INTRAVENOUS
  Administered 2016-11-19: 125 mL/h via INTRAVENOUS

## 2016-11-17 MED ORDER — FENTANYL CITRATE (PF) 250 MCG/5ML IJ SOLN
INTRAMUSCULAR | Status: AC
  Filled 2016-11-17: qty 5

## 2016-11-17 MED ORDER — KETOROLAC TROMETHAMINE 30 MG/ML IJ SOLN
INTRAMUSCULAR | Status: AC
  Filled 2016-11-17: qty 1

## 2016-11-17 MED ORDER — SCOPOLAMINE 1 MG/3DAYS TD PT72
MEDICATED_PATCH | TRANSDERMAL | Status: AC
  Administered 2016-11-17: 1.5 mg via TRANSDERMAL
  Filled 2016-11-17: qty 1

## 2016-11-17 MED ORDER — LACTATED RINGERS IV SOLN: INTRAVENOUS | Status: DC

## 2016-11-17 MED ORDER — DEXAMETHASONE SODIUM PHOSPHATE 10 MG/ML IJ SOLN
INTRAMUSCULAR | Status: DC | PRN
  Administered 2016-11-17: 10 mg via INTRAVENOUS

## 2016-11-17 MED ORDER — LACTATED RINGERS IV SOLN
INTRAVENOUS | Status: DC
  Administered 2016-11-17 (×3): via INTRAVENOUS
  Administered 2016-11-17: 1000 mL via INTRAVENOUS

## 2016-11-17 MED ORDER — ONDANSETRON HCL 4 MG PO TABS: 4.0000 mg | ORAL_TABLET | Freq: Four times a day (QID) | ORAL | Status: DC | PRN

## 2016-11-17 MED ORDER — DEXAMETHASONE SODIUM PHOSPHATE 10 MG/ML IJ SOLN
INTRAMUSCULAR | Status: AC
  Filled 2016-11-17: qty 1

## 2016-11-17 MED ORDER — HYDROMORPHONE 1 MG/ML IV SOLN
INTRAVENOUS | Status: DC
  Administered 2016-11-17: 14:00:00 via INTRAVENOUS
  Administered 2016-11-17: 1.2 mg via INTRAVENOUS
  Administered 2016-11-18: 2.7 mg via INTRAVENOUS
  Administered 2016-11-18: 0.3 mg via INTRAVENOUS
  Filled 2016-11-17: qty 25

## 2016-11-17 MED ORDER — MIDAZOLAM HCL 2 MG/2ML IJ SOLN
INTRAMUSCULAR | Status: AC
  Filled 2016-11-17: qty 2

## 2016-11-17 MED ORDER — LIDOCAINE HCL (CARDIAC) 20 MG/ML IV SOLN
INTRAVENOUS | Status: AC
  Filled 2016-11-17: qty 5

## 2016-11-17 SURGICAL SUPPLY — 48 items
APPLICATOR COTTON TIP 6IN STRL (MISCELLANEOUS) ×2 IMPLANT
BENZOIN TINCTURE PRP APPL 2/3 (GAUZE/BANDAGES/DRESSINGS) ×2 IMPLANT
CANISTER SUCT 3000ML (MISCELLANEOUS) ×2 IMPLANT
CLOTH BEACON ORANGE TIMEOUT ST (SAFETY) ×2 IMPLANT
CONT PATH 16OZ SNAP LID 3702 (MISCELLANEOUS) ×2 IMPLANT
DECANTER SPIKE VIAL GLASS SM (MISCELLANEOUS) IMPLANT
DRAPE CESAREAN BIRTH W POUCH (DRAPES) ×2 IMPLANT
DRAPE WARM FLUID 44X44 (DRAPE) IMPLANT
DRSG OPSITE POSTOP 4X10 (GAUZE/BANDAGES/DRESSINGS) ×2 IMPLANT
DRSG TELFA 3X8 NADH (GAUZE/BANDAGES/DRESSINGS) ×2 IMPLANT
DURAPREP 26ML APPLICATOR (WOUND CARE) ×2 IMPLANT
GAUZE SPONGE 4X4 16PLY XRAY LF (GAUZE/BANDAGES/DRESSINGS) IMPLANT
GLOVE BIOGEL M 6.5 STRL (GLOVE) ×4 IMPLANT
GLOVE BIOGEL PI IND STRL 6.5 (GLOVE) ×1 IMPLANT
GLOVE BIOGEL PI IND STRL 7.0 (GLOVE) ×1 IMPLANT
GLOVE BIOGEL PI INDICATOR 6.5 (GLOVE) ×1
GLOVE BIOGEL PI INDICATOR 7.0 (GLOVE) ×1
GLOVE ECLIPSE 6.5 STRL STRAW (GLOVE) ×6 IMPLANT
GOWN STRL REUS W/TWL LRG LVL3 (GOWN DISPOSABLE) ×6 IMPLANT
HEMOSTAT ARISTA ABSORB 3G PWDR (MISCELLANEOUS) ×2 IMPLANT
NEEDLE HYPO 22GX1.5 SAFETY (NEEDLE) ×2 IMPLANT
NS IRRIG 1000ML POUR BTL (IV SOLUTION) ×2 IMPLANT
PACK ABDOMINAL GYN (CUSTOM PROCEDURE TRAY) ×2 IMPLANT
PAD DRESSING TELFA 3X8 NADH (GAUZE/BANDAGES/DRESSINGS) ×1 IMPLANT
PAD OB MATERNITY 4.3X12.25 (PERSONAL CARE ITEMS) ×2 IMPLANT
PENCIL SMOKE EVAC W/HOLSTER (ELECTROSURGICAL) ×2 IMPLANT
PROTECTOR NERVE ULNAR (MISCELLANEOUS) ×2 IMPLANT
RTRCTR C-SECT PINK 25CM LRG (MISCELLANEOUS) ×2 IMPLANT
SPONGE LAP 18X18 X RAY DECT (DISPOSABLE) ×4 IMPLANT
STAPLER VISISTAT 35W (STAPLE) IMPLANT
STRIP CLOSURE SKIN 1/2X4 (GAUZE/BANDAGES/DRESSINGS) IMPLANT
SUT PLAIN 2 0 XLH (SUTURE) IMPLANT
SUT VIC AB 0 CT1 18XCR BRD8 (SUTURE) ×2 IMPLANT
SUT VIC AB 0 CT1 27 (SUTURE) ×2
SUT VIC AB 0 CT1 27XBRD ANBCTR (SUTURE) ×2 IMPLANT
SUT VIC AB 0 CT1 36 (SUTURE) ×2 IMPLANT
SUT VIC AB 0 CT1 8-18 (SUTURE) ×2
SUT VIC AB 2-0 CT1 (SUTURE) IMPLANT
SUT VIC AB 2-0 CT1 27 (SUTURE) ×2
SUT VIC AB 2-0 CT1 TAPERPNT 27 (SUTURE) ×2 IMPLANT
SUT VIC AB 2-0 SH 27 (SUTURE)
SUT VIC AB 2-0 SH 27XBRD (SUTURE) IMPLANT
SUT VIC AB 4-0 KS 27 (SUTURE) ×2 IMPLANT
SUT VICRYL 0 TIES 12 18 (SUTURE) ×2 IMPLANT
SYR CONTROL 10ML LL (SYRINGE) IMPLANT
TOWEL OR 17X24 6PK STRL BLUE (TOWEL DISPOSABLE) ×4 IMPLANT
TRAY FOLEY CATH SILVER 14FR (SET/KITS/TRAYS/PACK) ×2 IMPLANT
WATER STERILE IRR 1000ML POUR (IV SOLUTION) ×2 IMPLANT

## 2016-11-17 NOTE — OR Nursing (Signed)
Skin incision [id number].

## 2016-11-17 NOTE — Transfer of Care (Signed)
Immediate Anesthesia Transfer of Care Note  Patient: Claudia Gray  Procedure(s) Performed: Procedure(s): HYSTERECTOMY ABDOMINAL WITH SALPINGECTOMY (Bilateral)  Patient Location: PACU  Anesthesia Type:General  Level of Consciousness: awake, alert  and oriented  Airway & Oxygen Therapy: Patient Spontanous Breathing and Patient connected to nasal cannula oxygen  Post-op Assessment: Report given to RN and Post -op Vital signs reviewed and stable  Post vital signs: Reviewed and stable  Last Vitals:  Vitals:   11/17/16 0815  BP: 133/83  Pulse: 65  Resp: 16  Temp: 36.7 C    Last Pain:  Vitals:   11/17/16 0815  TempSrc: Oral  PainSc: 3       Patients Stated Pain Goal: 5 ([phone] [id number])  Complications: No apparent anesthesia complications

## 2016-11-17 NOTE — Anesthesia Procedure Notes (Signed)
Procedure Name: Intubation Date/Time: 11/17/2016 9:02 AM Performed by: Riki Sheer Pre-anesthesia Checklist: Patient identified, Emergency Drugs available, Suction available, Patient being monitored and Timeout performed Patient Re-evaluated:Patient Re-evaluated prior to inductionOxygen Delivery Method: Circle system utilized Preoxygenation: Pre-oxygenation with 100% oxygen Intubation Type: IV induction Ventilation: Mask ventilation without difficulty Laryngoscope Size: Miller and 2 Grade View: Grade I Tube type: Oral Tube size: 7.0 mm Number of attempts: 1 Airway Equipment and Method: Stylet Placement Confirmation: ETT inserted through vocal cords under direct vision,  positive ETCO2,  CO2 detector and breath sounds checked- equal and bilateral Secured at: 21 cm Tube secured with: Tape Dental Injury: Teeth and Oropharynx as per pre-operative assessment

## 2016-11-17 NOTE — Interval H&P Note (Signed)
History and Physical Interval Note:  11/17/2016 8:50 AM  Claudia Gray  has presented today for surgery, with the diagnosis of N93.9 AUB D25.9 Fibroid  The various methods of treatment have been discussed with the patient and family. After consideration of risks, benefits and other options for treatment, the patient has consented to  Procedure(s): HYSTERECTOMY ABDOMINAL WITH SALPINGECTOMY (Bilateral) as a surgical intervention .  The patient's history has been reviewed, patient examined, no change in status, stable for surgery.  I have reviewed the patient's chart and labs.  Questions were answered to the patient's satisfaction.     Janyth Pupa, M

## 2016-11-17 NOTE — Op Note (Signed)
Preop Dx: Abnormal uterine bleeding, Uterine fibroids, Pelvic pain Postop Dx: same Procedure: TAH, BS Surgeon: Dr. Janyth Pupa Assistant: Dr. Cyndia Skeeters Anesthesia: general Complications: none EBL: 250cc Fluids: 2700cc UOP: 250cc  Findings: Large multi-lobulated uterus, normal fallopian tubes and ovaries bilaterally  Specimens: Uterus with cervix and bilateral fallopian tubes  Procedure:  The patient was taken to the operating room where a general anesthetic was given. A timeout was performed confirming the appropriate procedure. The patient's abdomen was prepped with DuraPrep. Her perineum and vagina were prepped with Betadine. A Foley catheter was placed in the bladder. The patient was sterilely draped. A low transverse incision was made in the lower abdomen and carried sharply through the subcutaneous tissue to the fascia. The fascia was incised and extended laterally. The inferior aspect of the fascia was grasped with Kocher clamps and the underlying rectus muscle was dissected off sharply. In a similar fashion, the superior aspect of the fascia was elevated with Kocher clamps and the rectus muscle was dissected off. The rectus muscle was separated in the midline and the peritoneum was exposed. The peritoneum was entered sharply and the incision was extended superiorly and inferiorly with the Mezenbaum scissors.  Intraabdominal surveillance revealed the findings as mentioned above.  Due to the large size of the uterus, it was delivered through the incision.  An Alexis retractor was placed.  Attention was first turned to the left fallopian tube.  Serial ligation performed and the mesosalpinx was clamped, cut and ligated.  The round ligament was identified on the left. The round ligament was suture ligated and cut and the incision was carried down to create the vesicouterine flap.  The ovarian ligament was clamped, cut and doubly ligated.   Attention was turned to the left side.  The  right round ligament was identified, suture ligated and cut.  Adhesions were noted between the bladder and lower uterine segment which were both sharp and bluntly dissected. The remainder of the vesicouterine flap was created.  The right ovarian ligament was clamped cut and doubly ligated.  An avascular window was developed in the mesosalpinx and the fallopian tube was serially cut clamped and ligated.  Due to proximity to the right ovary and placement of prior clips a small portion <1cm of the fallopian tube remained insitu.  The uterine arteries were skeletonized, doubly clamped, cut and suture ligated. The cardinal ligaments and uterosacral ligaments were clamped, cut and suture ligated.Care was taken to ensure the bladder was taken down appropriately.  The body of the uterus was then removed to allow for better visualization. Curved heany clamps were placed at the angles of the vagina, the cervix was removed from the vaginal cuff with scissors. The vaginal cuff was closed with a continuous-running suture in a running fashion. The pelvis was irrigated.  Hemostasis was confirmed. Arista was placed over the vaginal cuff and adnexa.  All instruments and packs were removed from the abdominal cavity. The anterior peritoneum was reapproximated in a running fashion. The fascia was closed using a running suture of 0 vicryl. The skin was reapproximated using 4-0 vicryl on a Keith needle.  Sponge, needle, and instrument counts were correct. The patient tolerated the procedure well. She was awakened from her anesthetic without difficulty. She was transported to the recovery room in stable condition. She was noted to drain clear yellow urine. All specimens were sent to pathology. Dr. Simona Huh was present due to the complex nature of this case and that no residents are available  to our service.  Janyth Pupa, DO [phone] (pager) [phone] (office)

## 2016-11-17 NOTE — Anesthesia Postprocedure Evaluation (Signed)
Anesthesia Post Note  Patient: Claudia Gray  Procedure(s) Performed: Procedure(s) (LRB): HYSTERECTOMY ABDOMINAL WITH SALPINGECTOMY (Bilateral)  Patient location during evaluation: PACU Anesthesia Type: General Level of consciousness: awake and alert and patient cooperative Pain management: pain level controlled Vital Signs Assessment: post-procedure vital signs reviewed and stable Respiratory status: spontaneous breathing and respiratory function stable Cardiovascular status: stable Anesthetic complications: no        Last Vitals:  Vitals:   11/17/16 1402 11/17/16 1500  BP: 129/65 (!) 145/68  Pulse: 62 64  Resp: 16 12  Temp: 36.9 C 37.1 C    Last Pain:  Vitals:   11/17/16 1500  TempSrc: Oral  PainSc:    Pain Goal: Patients Stated Pain Goal: 5 (11/17/16 1409)               [place]

## 2016-11-17 NOTE — Progress Notes (Signed)
Pt OOB and ambulated to chair. Foley draining clear, yellow urine. Pt tolerating po fluids. Toya Smothers, RN

## 2016-11-17 NOTE — Anesthesia Preprocedure Evaluation (Addendum)
Anesthesia Evaluation  Patient identified by MRN, date of birth, ID band Patient awake    Reviewed: Allergy & Precautions, Patient's Chart, lab work & pertinent test results  History of Anesthesia Complications (+) PONV and history of anesthetic complications  Airway Mallampati: II  TM Distance: >3 FB Neck ROM: Full    Dental no notable dental hx. (+) Dental Advisory Given   Pulmonary neg pulmonary ROS,    Pulmonary exam normal        Cardiovascular negative cardio ROS Normal cardiovascular exam     Neuro/Psych PSYCHIATRIC DISORDERS Anxiety Depression negative neurological ROS     GI/Hepatic negative GI ROS, Neg liver ROS,   Endo/Other  negative endocrine ROS  Renal/GU negative Renal ROS     Musculoskeletal negative musculoskeletal ROS (+)   Abdominal   Peds  Hematology negative hematology ROS (+)   Anesthesia Other Findings Day of surgery medications reviewed with the patient.  Reproductive/Obstetrics                            Anesthesia Physical Anesthesia Plan  ASA: II  Anesthesia Plan: General   Post-op Pain Management:    Induction: Intravenous  Airway Management Planned: Oral ETT  Additional Equipment:   Intra-op Plan:   Post-operative Plan: Extubation in OR  Informed Consent: I have reviewed the patients History and Physical, chart, labs and discussed the procedure including the risks, benefits and alternatives for the proposed anesthesia with the patient or authorized representative who has indicated his/her understanding and acceptance.   Dental advisory given  Plan Discussed with: CRNA and Anesthesiologist  Anesthesia Plan Comments:        Anesthesia Quick Evaluation

## 2016-11-18 ENCOUNTER — Encounter (HOSPITAL_COMMUNITY): Payer: Self-pay | Admitting: Obstetrics & Gynecology

## 2016-11-18 LAB — BASIC METABOLIC PANEL
ANION GAP: 5 (ref 5–15)
BUN: 7 mg/dL (ref 6–20)
CALCIUM: 8.1 mg/dL — AB (ref 8.9–10.3)
CO2: 28 mmol/L (ref 22–32)
Chloride: 105 mmol/L (ref 101–111)
Creatinine, Ser: 0.62 mg/dL (ref 0.44–1.00)
GFR calc Af Amer: >60 mL/min (ref >60)
GLUCOSE: 119 mg/dL — AB (ref 65–99)
POTASSIUM: 4.1 mmol/L (ref 3.5–5.1)
Sodium: 138 mmol/L (ref 135–145)

## 2016-11-18 LAB — CBC
HEMATOCRIT: 31.9 % — AB (ref 36.0–46.0)
Hemoglobin: 10.7 g/dL — ABNORMAL LOW (ref 12.0–15.0)
MCH: 29.4 pg (ref 26.0–34.0)
MCHC: 33.5 g/dL (ref 30.0–36.0)
MCV: 87.6 fL (ref 78.0–100.0)
PLATELETS: 212 10*3/uL (ref 150–400)
RBC: 3.64 MIL/uL — AB (ref 3.87–5.11)
RDW: 13.5 % (ref 11.5–15.5)
WBC: 11.1 10*3/uL — AB (ref 4.0–10.5)

## 2016-11-18 MED ORDER — OXYCODONE-ACETAMINOPHEN 5-325 MG PO TABS
1.0000 | ORAL_TABLET | ORAL | Status: DC | PRN
  Administered 2016-11-18 – 2016-11-19 (×3): 1 via ORAL
  Filled 2016-11-18 (×3): qty 1

## 2016-11-18 MED ORDER — DOCUSATE SODIUM 100 MG PO CAPS: 100.0000 mg | ORAL_CAPSULE | Freq: Two times a day (BID) | ORAL | 0 refills | Status: AC

## 2016-11-18 MED ORDER — IBUPROFEN 600 MG PO TABS
600.0000 mg | ORAL_TABLET | Freq: Four times a day (QID) | ORAL | Status: DC | PRN
  Administered 2016-11-18 – 2016-11-19 (×2): 600 mg via ORAL
  Filled 2016-11-18 (×2): qty 1

## 2016-11-18 MED ORDER — IBUPROFEN 600 MG PO TABS: 600.0000 mg | ORAL_TABLET | Freq: Four times a day (QID) | ORAL | 0 refills | Status: AC | PRN

## 2016-11-18 NOTE — Progress Notes (Signed)
Postoperative Note Day # 1  S:  Patient resting comfortable in bed.  Pain controlled.  Tolerating clears. No flatus, no BM.  Minimal spotting.  Ambulating without difficulty.  She denies n/v/f/c, SOB, or CP.  Overall doing well and reports no acute complaints.  O: Temp:  [97.8 F (36.6 C)-99.2 F (37.3 C)] 98.3 F (36.8 C) (02/02 0445) Pulse Rate:  [46-84] 60 (02/02 0445) Resp:  [8-19] 13 (02/02 0600) BP: (100-150)/(49-85) 100/49 (02/02 0445) SpO2:  [98 %-100 %] 100 % (02/02 0600) FiO2 (%):  [48 %-66 %] 66 % (02/02 0600) Weight:  [167 lb (75.8 kg)] 167 lb (75.8 kg) (02/01 1402)   UOP: 2470cc/8hr  Gen: A&Ox3, NAD CV: RRR, no MRG Resp: CTAB Abdomen: soft, NT, ND +BS Incision: c/d/i, bandage on Ext: No edema, no calf tenderness bilaterally, SCDs in place  Labs: CBC/BMP pending this am  A/P: Pt is a 46 y.o. female s/p TAH, BS, POD#1  - Pain well controlled, PCA discontinued, will transition to oral pain mediation today -GU: UOP is adequate, foley removed this am -GI: Advance to general diet as tolerated -Activity: encouraged sitting up to chair and ambulation as tolerated -Prophylaxis: early ambulation, SCDs while in bed -Labs: pending -h/o Depression/anxiety: continue Lexapro daily  Janyth Pupa, DO [phone] (pager) [phone] (office)

## 2016-11-18 NOTE — Plan of Care (Signed)
Problem: Skin Integrity: Goal: Risk for impaired skin integrity will decrease Outcome: Completed/Met Date Met: 11/18/16 Pt is ambulatory, no signs of impaired skin integrity  Problem: Tissue Perfusion: Goal: Risk factors for ineffective tissue perfusion will decrease Outcome: Progressing Pt is ambulatory and also using SCDs appropriately. No s/sx of tissue perfusion issues.

## 2016-11-18 NOTE — Plan of Care (Signed)
Problem: Urinary Elimination: Goal: Ability to reestablish a normal urinary elimination pattern will improve Outcome: Progressing Foley d/c this morning, pt due to void at this time. Encouraged pt to attempt frequently until she is successful.

## 2016-11-18 NOTE — Discharge Instructions (Signed)
Abdominal Hysterectomy, Care After Refer to this sheet in the next few weeks. These instructions provide you with information on caring for yourself after your procedure. Your health care provider may also give you more specific instructions. Your treatment has been planned according to current medical practices, but problems sometimes occur. Call your health care provider if you have any problems or questions after your procedure. What can I expect after the procedure? After your procedure, it is typical to have the following:  Pain.  Feeling tired.  Poor appetite.  Less interest in sex. It takes 4-6 weeks to recover from this surgery. Follow these instructions at home:  Take pain medicines only as directed by your health care provider. Do not take over-the-counter pain medicines without checking with your health care provider first.  Change your bandage as directed by your health care provider.  Return to your health care provider to have your sutures taken out.  Take showers instead of baths for 2-3 weeks. Ask your health care provider when it is safe to start showering.  Do not douche, use tampons, or have sexual intercourse for at least 6 weeks or until your health care provider says you can.  Follow your health care provider's advice about exercise, lifting, driving, and general activities.  Get plenty of rest and sleep.  Do not lift anything heavier than a gallon of milk (about 10 lb [4.5 kg]) for the first month after surgery.  You can resume your normal diet if your health care provider says it is okay.  Do not drink alcohol until your health care provider says you can.  If you are constipated, ask your health care provider if you can take a mild laxative.  Eating foods high in fiber may also help with constipation. Eat plenty of raw fruits and vegetables, whole grains, and beans.  Drink enough fluids to keep your urine clear or pale yellow.  Try to have someone at  home with you for the first 1-2 weeks to help around the house.  Keep all follow-up appointments. Contact a health care provider if:  You have chills or fever.  You have swelling, redness, or pain in the area of your incision that is getting worse.  You have pus coming from the incision.  You notice a bad smell coming from the incision or bandage.  Your incision breaks open.  You feel dizzy or light-headed.  You have pain or bleeding when you urinate.  You have persistent diarrhea.  You have persistent nausea and vomiting.  You have abnormal vaginal discharge.  You have a rash.  You have any type of abnormal reaction or develop an allergy to your medicine.  Your pain medicine is not helping. Get help right away if:  You have a fever and your symptoms suddenly get worse.  You have severe abdominal pain.  You have chest pain.  You have shortness of breath.  You faint.  You have pain, swelling, or redness of your leg.  You have heavy vaginal bleeding with blood clots. This information is not intended to replace advice given to you by your health care provider. Make sure you discuss any questions you have with your health care provider. Document Released: 04/22/2005 Document Revised: 03/16/2016 Document Reviewed: 07/26/2013 Elsevier Interactive Patient Education  2017 [employer].

## 2016-11-19 ENCOUNTER — Encounter (HOSPITAL_COMMUNITY): Payer: Self-pay

## 2016-11-19 MED ORDER — OXYCODONE-ACETAMINOPHEN 5-325 MG PO TABS: 1.0000 | ORAL_TABLET | ORAL | 0 refills | Status: AC | PRN

## 2016-11-19 NOTE — Progress Notes (Signed)
Postoperative Note Day # 2  S:  Patient resting comfortable in bed.  Pain controlled.  Tolerating general diet. + flatus, + BM.  No vaginal bleeding.  Ambulating without difficulty.  She denies n/v/f/c, SOB, or CP.  Overall doing well and reports no acute complaints.  O: Temp:  [97.8 F (36.6 C)-99.1 F (37.3 C)] 98.8 F (37.1 C) (02/03 0807) Pulse Rate:  [61-87] 61 (02/03 0807) Resp:  [16-18] 16 (02/03 0807) BP: (98-122)/(56-67) 122/67 (02/03 0807) SpO2:  [96 %-98 %] 98 % (02/03 0807)   UOP: 2470cc/8hr  Gen: A&Ox3, NAD CV: RRR, no MRG Resp: CTAB Abdomen: soft, NT, ND +BS Incision: c/d/i, bandage on Ext: No edema, no calf tenderness bilaterally, SCDs in place  Labs:  CBC Latest Ref Rng & Units 11/18/2016 11/14/2016  WBC 4.0 - 10.5 K/uL 11.1(H) 5.7  Hemoglobin 12.0 - 15.0 g/dL 10.7(L) 12.3  Hematocrit 36.0 - 46.0 % 31.9(L) 36.1  Platelets 150 - 400 K/uL 212 215    A/P: Pt is a 46 y.o. female s/p TAH, BS, POD#2  - Pain well controlled with oral medication -GU: Voiding freely -GI: Tolerating general diet -Activity: encouraged sitting up to chair and ambulation as tolerated -Prophylaxis: early ambulation, SCDs while in bed -Labs: stable -h/o Depression/anxiety: continue Lexapro daily  DISPO: Meeting postop milestones appropriately, plan for discharge home today.  Janyth Pupa, DO [phone] (pager) [phone] (office)

## 2016-11-19 NOTE — Progress Notes (Signed)
Patient discharged home with family. Discharge paperwork, instructions, follow-up appts, and home care reviewed. Prescriptions given to patient. No questions at this time.

## 2016-11-23 NOTE — Discharge Summary (Signed)
Physician Discharge Summary  Patient ID: Claudia Gray MRN: [id number] DOB/AGE: 1970/10/23 46 y.o.  Admit date: 11/17/2016 Discharge date: 11/23/2016  Admission Diagnoses:  Discharge Diagnoses:  Active Problems:   Abnormal uterine bleeding   Discharged Condition: stable  Hospital Course: 46yo G1P1 who presented for scheduled TAH, BS due to continued abnormal uterine bleeding.  Since August she has had daily bleeding. The bleeding starts out brown and then turns to bright red by the end of the day. She uses about 2 tampons per day. The bleeding doesn't change and has been continous since that time. She does note occasional pelvic pain; however, she thinks this may be related to her constipation.  Of note she has a h/o of an endometrial ablation as well as a tubal ligation.  Conservative therapy was attempted with medications; however, she noted minimal to no change in her menses and wished to proceed with surgical intervention.  A TAH, BS was performed- see operative report for further information.  Her postoperative course was uncomplicated and she was discharged home in stable condition on POD#2.  Consults: None  Significant Diagnostic Studies: labs:  CBC Latest Ref Rng & Units 11/18/2016 11/14/2016  WBC 4.0 - 10.5 K/uL 11.1(H) 5.7  Hemoglobin 12.0 - 15.0 g/dL 10.7(L) 12.3  Hematocrit 36.0 - 46.0 % 31.9(L) 36.1  Platelets 150 - 400 K/uL 212 215     Treatments: IV hydration, antibiotics: Gent/Clinda, analgesia: Dilaudid followed by Percocet and surgery: total abdominal hysterectomy, bilateral salpingectomy  Discharge Exam: Blood pressure 122/67, pulse 61, temperature 98.8 F (37.1 C), temperature source Oral, resp. rate 16, height 5\' 1"  (1.549 m), weight 167 lb (75.8 kg), last menstrual period 11/17/2016, SpO2 98 %. Gen: A&Ox3, NAD CV: RRR, no MRG Resp: CTAB Abdomen: soft, NT, ND +BS Incision: c/d/i, bandage on Ext: No edema, no calf tenderness bilaterally, SCDs in  place  Disposition: 01-Home or Self Care  Discharge Instructions    Discharge patient    Complete by:  As directed    Discharge disposition:  01-Home or Self Care   Discharge patient date:  11/19/2016     Allergies as of 11/19/2016      Reactions   Augmentin [amoxicillin-pot Clavulanate] Rash   Has patient had a PCN reaction causing immediate rash, facial/tongue/throat swelling, SOB or lightheadedness with hypotension: Yes Has patient had a PCN reaction causing severe rash involving mucus membranes or skin necrosis:No Has patient had a PCN reaction that required hospitalization:No Has patient had a PCN reaction occurring within the last 10 years: Yes If all of the above answers are "NO", then may proceed with Cephalosporin use.      Medication List    STOP taking these medications   AVIANE 0.1-20 MG-MCG tablet Generic drug:  levonorgestrel-ethinyl estradiol     TAKE these medications   docusate sodium 100 MG capsule Commonly known as:  COLACE Take 1 capsule (100 mg total) by mouth 2 (two) times daily.   escitalopram 10 MG tablet Commonly known as:  LEXAPRO Take 5 mg by mouth every evening.   ibuprofen 600 MG tablet Commonly known as:  ADVIL,MOTRIN Take 1 tablet (600 mg total) by mouth every 6 (six) hours as needed for mild pain.   oxyCODONE-acetaminophen 5-325 MG tablet Commonly known as:  PERCOCET/ROXICET Take 1 tablet by mouth every 4 (four) hours as needed for moderate pain or severe pain.      Follow-up Information    Janyth Pupa, M, DO Follow up in 2 week(s).  Specialty:  Obstetrics and Gynecology Contact information: [id number] [place] [address] [phone]           Signed: Annalee Genta 11/23/2016, 6:28 AM

## 2018-01-29 IMAGING — US US PELVIS COMPLETE
1 series · 13 of 25 positions shown · non-contrast
Comparison: None in PACs

CLINICAL DATA: Menorrhagia, irregular menstrual cycle, history of
endometrial ablation.

EXAM:
TRANSABDOMINAL AND TRANSVAGINAL ULTRASOUND OF PELVIS
TECHNIQUE: Both transabdominal and transvaginal ultrasound examinations of the
pelvis were performed. Transabdominal technique was performed for
global imaging of the pelvis including uterus, ovaries, adnexal
regions, and pelvic cul-de-sac. It was necessary to proceed with
endovaginal exam following the transabdominal exam to visualize the
endometrium and right ovary.

[Series 1: us pelvis complete · 0.30mm/px · 13 of 53 slices shown]
[im 1/53]
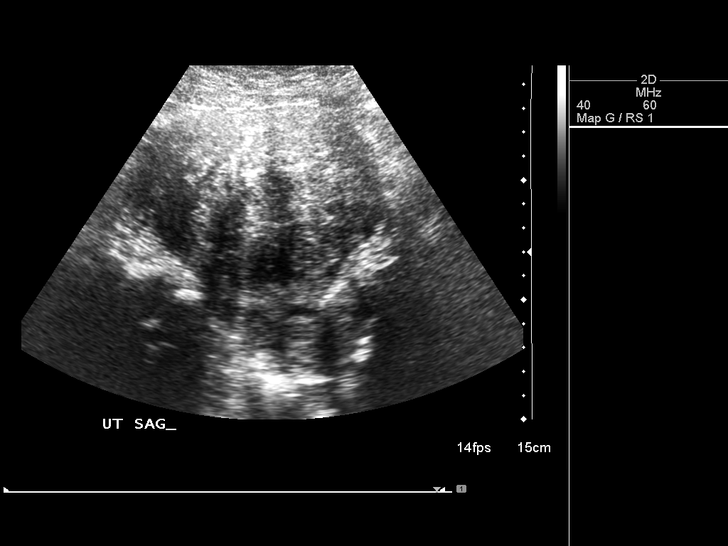
[im 5/53]
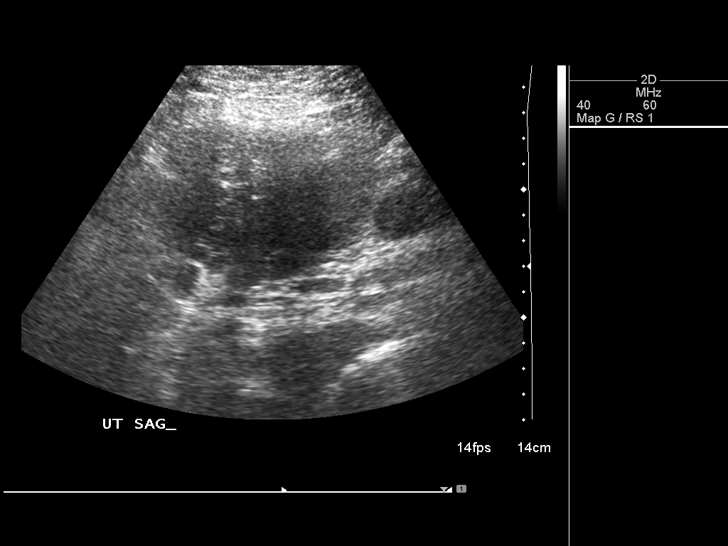
[im 9/53]
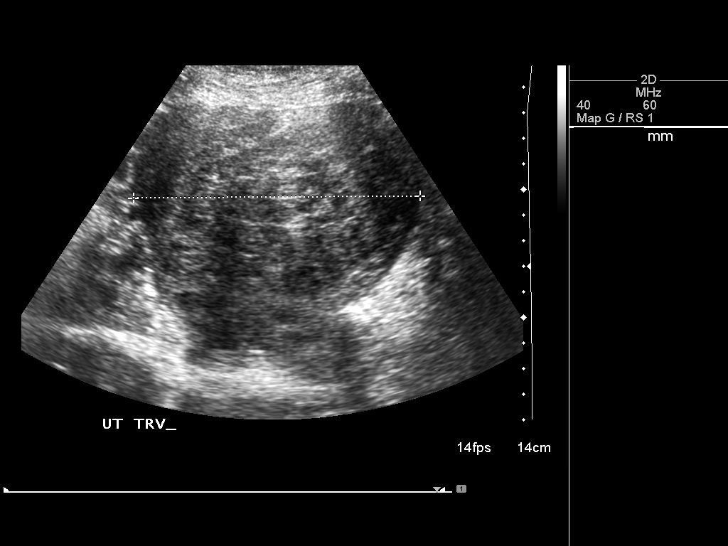
[im 14/53]
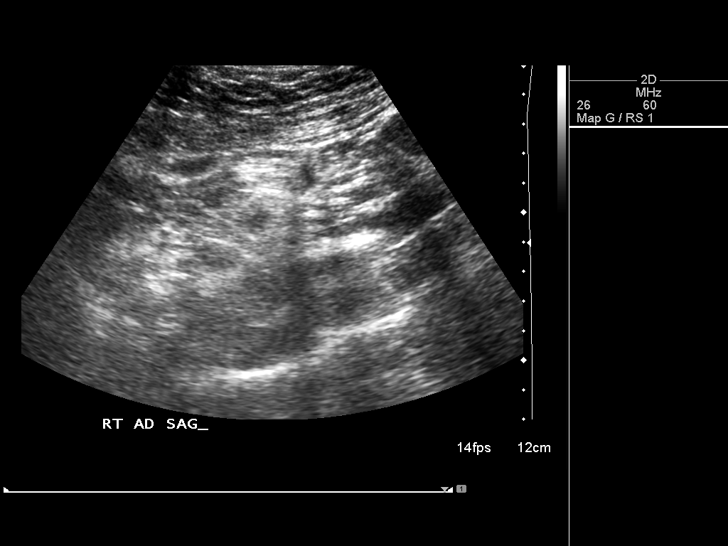
[im 18/53]
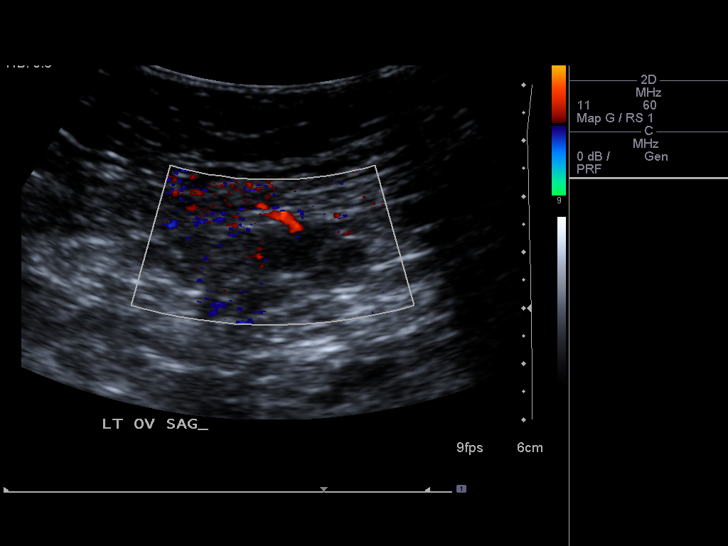
[im 22/53]
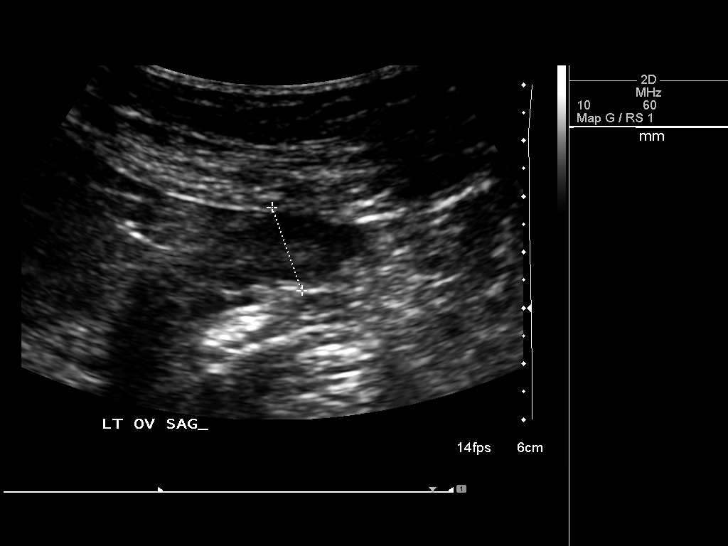
[im 27/53]
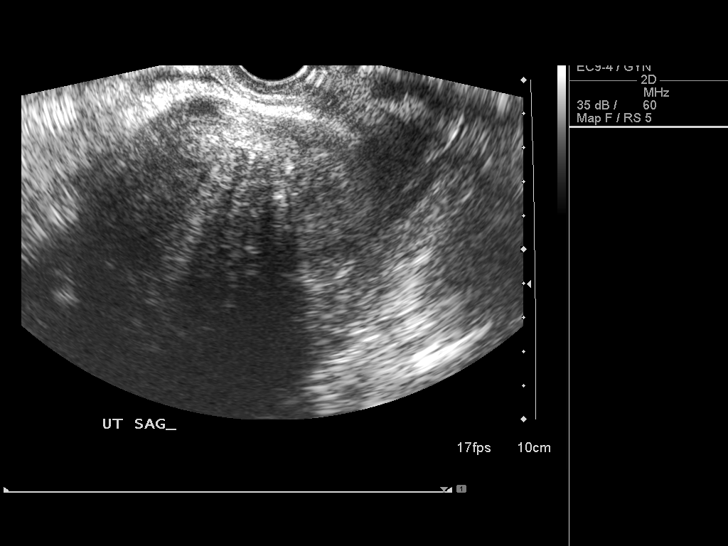
[im 31/53]
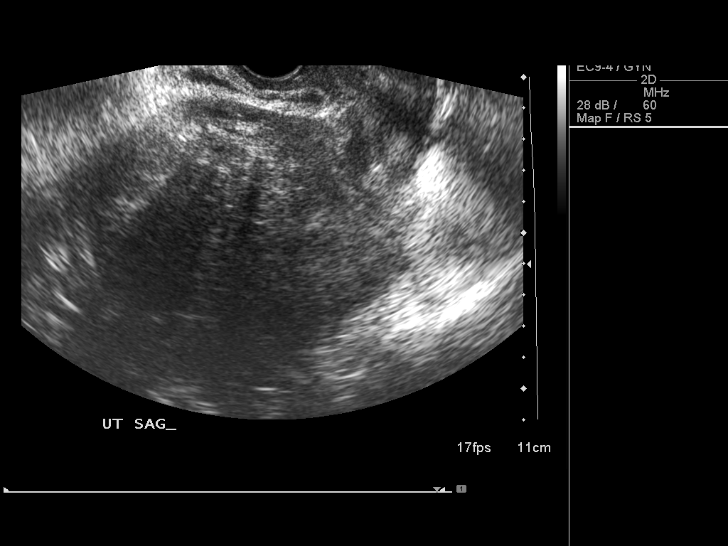
[im 35/53]
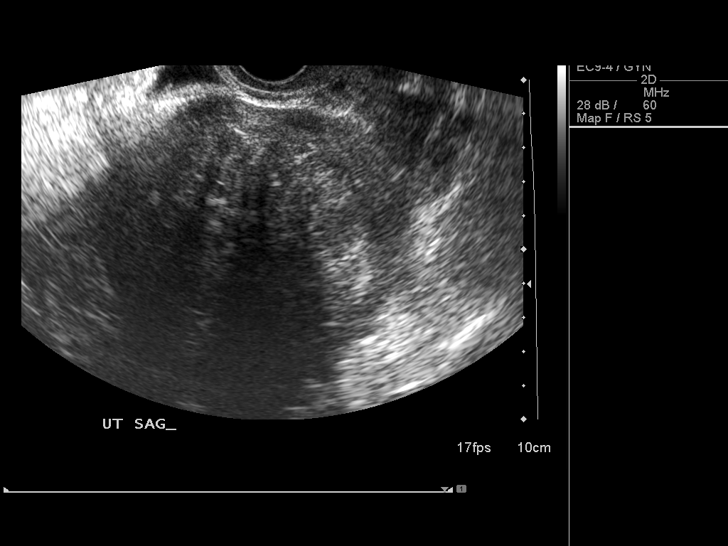
[im 40/53]
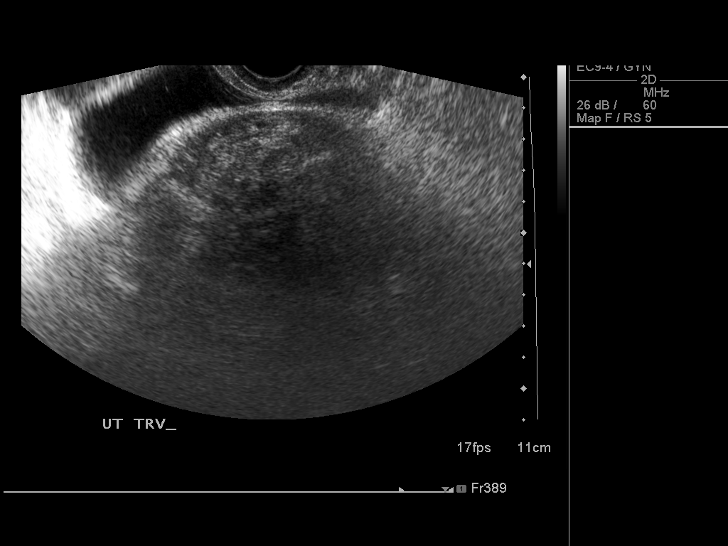
[im 44/53]
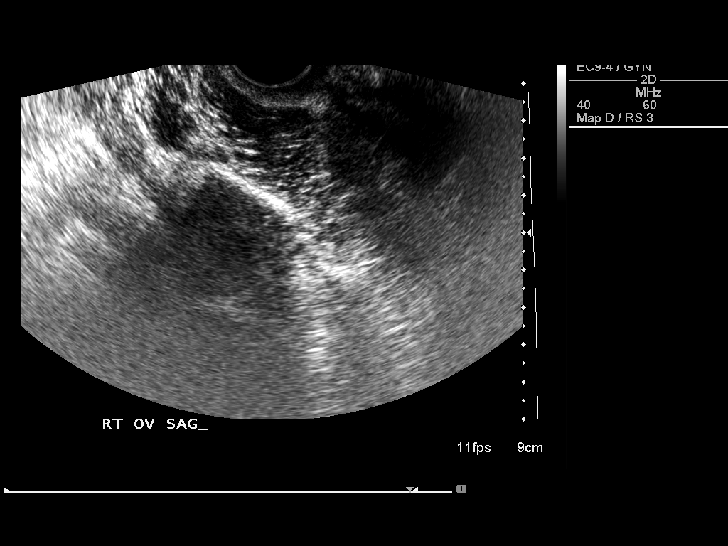
[im 48/53]
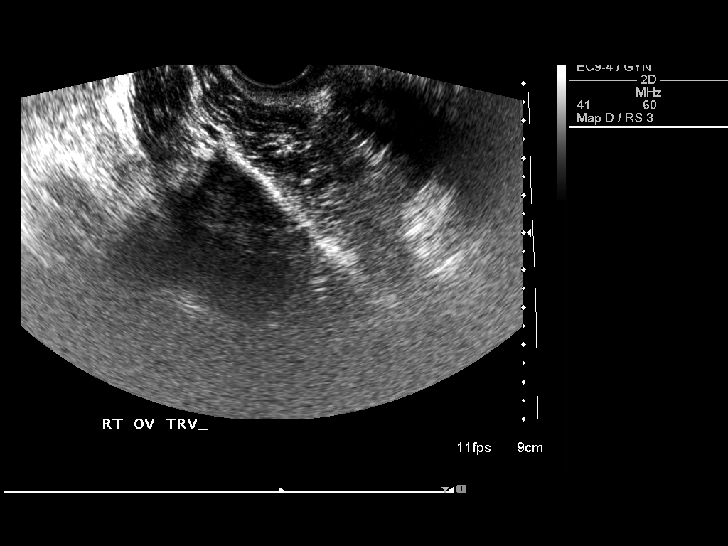
[im 53/53]
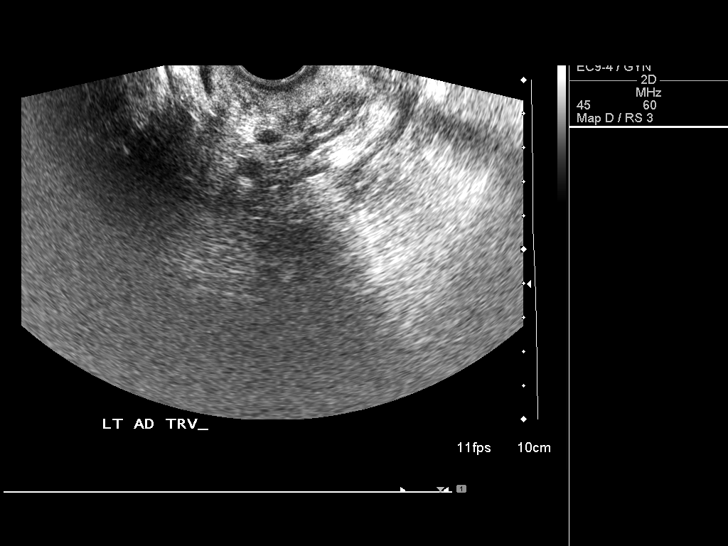

[13 of 25 positions shown; findings below may reference images not displayed]

FINDINGS: Uterus

Measurements: 11.8 x 9.7 x 11.2 cm.. There is a dominant fibroid in
the uterine fundus which limits visualization of the myometrium and
endometrium. The fibroid measures 8.1 x 6.1 x 6.6 cm.

Endometrium

Thickness: The endometrium cannot be accurately measured due to
shadowing and mass-effect from the large fibroid..

Right ovary

Measurements: 4.6 x 3.2 x 3.3 cm. Normal appearance/no adnexal mass.

Left ovary

Measurements: 3.7 x 1.5 x 1.6 cm. Normal appearance/no adnexal mass.

Other findings

No abnormal free fluid.
IMPRESSION: Dominant fundal fibroid measuring up to 8.1 cm. This obscures the
endometrial stripe. In the absence of accurate measurement of the
endometrial thickness and in the setting of post-menopausal
bleeding, endometrial sampling is indicated to exclude carcinoma. If
results are benign, sonohysterogram should be considered for focal
lesion work-up. (Ref: Radiological Reasoning: Algorithmic Workup of
Abnormal Vaginal Bleeding with Endovaginal Sonography and
Sonohysterography. AJR [id number]; 191:S68-73)

Normal appearance of the ovaries and adnexal structures.
